# Patient Record
Sex: Male | Born: 1970 | Race: Black or African American | Hispanic: No | Marital: Married | Smoking: Current every day smoker
Health system: Southern US, Community
[De-identification: ages and names within clinical notes are randomized; demographics above are authoritative.]

---

## 1998-10-26 ENCOUNTER — Encounter: Payer: Self-pay | Admitting: Emergency Medicine

## 1998-10-26 ENCOUNTER — Emergency Department (HOSPITAL_COMMUNITY): Admission: EM | Admit: 1998-10-26 | Discharge: 1998-10-26 | Payer: Self-pay | Admitting: Emergency Medicine

## 1998-12-17 ENCOUNTER — Inpatient Hospital Stay (HOSPITAL_COMMUNITY): Admission: EM | Admit: 1998-12-17 | Discharge: 1998-12-19 | Payer: Self-pay | Admitting: Emergency Medicine

## 1998-12-17 ENCOUNTER — Encounter: Payer: Self-pay | Admitting: General Surgery

## 1998-12-17 ENCOUNTER — Encounter: Payer: Self-pay | Admitting: Emergency Medicine

## 1998-12-18 ENCOUNTER — Encounter: Payer: Self-pay | Admitting: General Surgery

## 1999-05-15 ENCOUNTER — Encounter: Admission: RE | Admit: 1999-05-15 | Discharge: 1999-05-15 | Payer: Self-pay | Admitting: Family Medicine

## 1999-08-01 ENCOUNTER — Encounter: Admission: RE | Admit: 1999-08-01 | Discharge: 1999-08-01 | Payer: Self-pay | Admitting: Family Medicine

## 2006-11-09 ENCOUNTER — Emergency Department (HOSPITAL_COMMUNITY): Admission: EM | Admit: 2006-11-09 | Discharge: 2006-11-10 | Payer: Self-pay | Admitting: Emergency Medicine

## 2007-07-26 ENCOUNTER — Emergency Department (HOSPITAL_COMMUNITY): Admission: EM | Admit: 2007-07-26 | Discharge: 2007-07-26 | Payer: Self-pay | Admitting: Emergency Medicine

## 2009-01-08 ENCOUNTER — Emergency Department (HOSPITAL_COMMUNITY): Admission: EM | Admit: 2009-01-08 | Discharge: 2009-01-08 | Payer: Self-pay | Admitting: Emergency Medicine

## 2009-01-10 ENCOUNTER — Emergency Department (HOSPITAL_COMMUNITY): Admission: EM | Admit: 2009-01-10 | Discharge: 2009-01-10 | Payer: Self-pay | Admitting: Emergency Medicine

## 2009-01-30 ENCOUNTER — Emergency Department (HOSPITAL_COMMUNITY): Admission: EM | Admit: 2009-01-30 | Discharge: 2009-01-31 | Payer: Self-pay | Admitting: Emergency Medicine

## 2009-10-14 ENCOUNTER — Emergency Department (HOSPITAL_COMMUNITY): Admission: EM | Admit: 2009-10-14 | Discharge: 2009-10-14 | Payer: Self-pay | Admitting: Emergency Medicine

## 2015-04-18 ENCOUNTER — Encounter (HOSPITAL_COMMUNITY): Payer: Self-pay | Admitting: Emergency Medicine

## 2015-04-18 ENCOUNTER — Emergency Department (HOSPITAL_COMMUNITY)
Admission: EM | Admit: 2015-04-18 | Discharge: 2015-04-19 | Disposition: A | Discharge status: Home / Self Care | Payer: Self-pay | Attending: Emergency Medicine | Admitting: Emergency Medicine

## 2015-04-18 DIAGNOSIS — Y998 Other external cause status: Secondary | ICD-10-CM | POA: Insufficient documentation

## 2015-04-18 DIAGNOSIS — W2209XA Striking against other stationary object, initial encounter: Secondary | ICD-10-CM | POA: Insufficient documentation

## 2015-04-18 DIAGNOSIS — Y9389 Activity, other specified: Secondary | ICD-10-CM | POA: Insufficient documentation

## 2015-04-18 DIAGNOSIS — S91011A Laceration without foreign body, right ankle, initial encounter: Secondary | ICD-10-CM | POA: Insufficient documentation

## 2015-04-18 DIAGNOSIS — Z23 Encounter for immunization: Secondary | ICD-10-CM | POA: Insufficient documentation

## 2015-04-18 DIAGNOSIS — Z720 Tobacco use: Secondary | ICD-10-CM | POA: Insufficient documentation

## 2015-04-18 DIAGNOSIS — Y9289 Other specified places as the place of occurrence of the external cause: Secondary | ICD-10-CM | POA: Insufficient documentation

## 2015-04-18 NOTE — ED Provider Notes (Signed)
CSN: [id number]     Arrival date & time 04/18/15  2229 History   This chart was scribed for non-physician practitioner, Dierdre ForthHannah Gray Doering PA-C working with April Palumbo, MD by Arlan OrganAshley Leger, ED Scribe. This patient was seen in room TR04C/TR04C and the patient's care was started at 12:07 AM.   Chief Complaint  Patient presents with  . Extremity Laceration   The history is provided by the patient and medical records. No language interpreter was used.    HPI Comments: Frank Wiley is a 44 y.o. male without any pertinent past medical history who presents to the Emergency Department here for a laceration to the R ankle sustained 4:30 PM this afternoon. Pt states he cut his ankle against a sharp pipe this evening. Bleeding controlled at this time. Area is opened and covered with duct tape. He now c/o constant, ongoing mild pain to area. No fever or chills. No weakness, numbness, or loss of sensation. Pt is unaware of Tetanus status. No known allergies to medications.  History reviewed. No pertinent past medical history. History reviewed. No pertinent past surgical history. History reviewed. No pertinent family history. History  Substance Use Topics  . Smoking status: Current Every Day Smoker -- 0.50 packs/day    Types: Cigarettes  . Smokeless tobacco: Not on file  . Alcohol Use: No    Review of Systems  Constitutional: Negative for fever and chills.  Gastrointestinal: Negative for nausea and vomiting.  Musculoskeletal: Positive for arthralgias.  Skin: Positive for wound.  Allergic/Immunologic: Negative for immunocompromised state.  Neurological: Negative for weakness and numbness.  Hematological: Does not bruise/bleed easily.  Psychiatric/Behavioral: Negative for confusion. The patient is not nervous/anxious.       Allergies  Review of patient's allergies indicates no known allergies.  Home Medications   Prior to Admission medications   Not on File   Triage Vitals: BP  121/84 mmHg  Pulse 73  Temp(Src) 98 F (36.7 C) (Oral)  Resp 22  SpO2 98%   Physical Exam  Constitutional: He is oriented to person, place, and time. He appears well-developed and well-nourished. No distress.  HENT:  Head: Normocephalic and atraumatic.  Eyes: Conjunctivae are normal. No scleral icterus.  Neck: Normal range of motion.  Cardiovascular: Normal rate, regular rhythm, normal heart sounds and intact distal pulses.   No murmur heard. Capillary refill < 3 sec  Pulmonary/Chest: Effort normal and breath sounds normal. No respiratory distress.  Musculoskeletal: Normal range of motion. He exhibits no edema.  ROM: Full range of motion of the right ankle  Neurological: He is alert and oriented to person, place, and time.  Sensation: Intact to dull and sharp in the right lower extremity Strength: 5/5 with dorsiflexion and plantar flexion of the right lower extremity  Skin: Skin is warm and dry. He is not diaphoretic.  3 cm laceration to the anterior right ankle  Psychiatric: He has a normal mood and affect.  Nursing note and vitals reviewed.   ED Course  Procedures (including critical care time)  DIAGNOSTIC STUDIES: Oxygen Saturation is 98% on RA, Normal by my interpretation.    COORDINATION OF CARE: 12:10 PM- Will perform laceration repair. Discussed treatment plan with pt at bedside and pt agreed to plan.     LACERATION REPAIR Performed by: Dierdre ForthHannah Shanasia Ibrahim PA-C  Consent: Verbal consent obtained. Risks and benefits: risks, benefits and alternatives were discussed Patient identity confirmed: provided demographic data Time out performed prior to procedure Prepped and Draped in normal sterile fashion  Wound explored Laceration Location: right ankle Laceration Length: 3cm No Foreign Bodies seen or palpated Anesthesia: local infiltration Local anesthetic: lidocaine 1% with epinephrine Anesthetic total: 5 ml Irrigation method: syringe Amount of cleaning:  standard Skin closure: 5-0 prolene Number of sutures or staples: 3 Technique: simple interrupted Patient tolerance: Patient tolerated the procedure well with no immediate complications.   Labs Review Labs Reviewed - No data to display  Imaging Review No results found.   EKG Interpretation None      MDM   Final diagnoses:  Laceration of right ankle, initial encounter   DELAWRENCE FRIDMAN presents with laceration to the right anterior ankle.  Pressure irrigation performed. Wound explored and base of wound visualized in a bloodless field without evidence of foreign body.  Laceration occurred < 8 hours prior to repair which was well tolerated. Tdap updated.  Pt has no comorbidities to effect normal wound healing. Pt discharged without antibiotics.  Discussed suture home care with patient and answered questions. Pt to follow-up for wound check and suture removal in 7 days; they are to return to the ED sooner for signs of infection. Patient given ASO splint for ankle to prevent wound dehiscence. Pt is hemodynamically stable with no complaints prior to dc.   I personally performed the services described in this documentation, which was scribed in my presence. The recorded information has been reviewed and is accurate.    Dierdre Forth, PA-C 04/19/15 0052  April Palumbo, MD 04/19/15 0139

## 2015-04-18 NOTE — ED Notes (Signed)
Patient cut right ankle on pipe. States happened today. Bleeding controlled at this time via duct tape bandage.

## 2015-04-19 MED ORDER — LIDOCAINE-EPINEPHRINE 1 %-1:100000 IJ SOLN
10.0000 mL | Freq: Once | INTRAMUSCULAR | Status: AC
  Administered 2015-04-19: 10 mL
  Filled 2015-04-19: qty 1

## 2015-04-19 MED ORDER — TETANUS-DIPHTH-ACELL PERTUSSIS 5-2.5-18.5 LF-MCG/0.5 IM SUSP
0.5000 mL | Freq: Once | INTRAMUSCULAR | Status: AC
  Administered 2015-04-19: 0.5 mL via INTRAMUSCULAR
  Filled 2015-04-19: qty 0.5

## 2015-04-19 NOTE — Discharge Instructions (Signed)

## 2015-12-31 ENCOUNTER — Ambulatory Visit: Payer: Self-pay | Admitting: Internal Medicine

## 2016-01-08 ENCOUNTER — Encounter: Payer: Self-pay | Admitting: Internal Medicine

## 2016-01-08 ENCOUNTER — Ambulatory Visit: Payer: Self-pay | Attending: Internal Medicine | Admitting: Internal Medicine

## 2016-01-08 VITALS — BP 120/78 | HR 69 | Temp 98.0°F | Resp 16 | Ht 65.0 in | Wt 180.8 lb

## 2016-01-08 DIAGNOSIS — F17210 Nicotine dependence, cigarettes, uncomplicated: Secondary | ICD-10-CM | POA: Insufficient documentation

## 2016-01-08 DIAGNOSIS — Z0001 Encounter for general adult medical examination with abnormal findings: Secondary | ICD-10-CM | POA: Insufficient documentation

## 2016-01-08 DIAGNOSIS — F17200 Nicotine dependence, unspecified, uncomplicated: Secondary | ICD-10-CM

## 2016-01-08 DIAGNOSIS — Z0000 Encounter for general adult medical examination without abnormal findings: Secondary | ICD-10-CM

## 2016-01-08 DIAGNOSIS — G250 Essential tremor: Secondary | ICD-10-CM

## 2016-01-08 LAB — CBC WITH DIFFERENTIAL/PLATELET
BASOS PCT: 1 %
Basophils Absolute: 44 cells/uL (ref 0–200)
EOS PCT: 1 %
Eosinophils Absolute: 44 cells/uL (ref 15–500)
HEMATOCRIT: 40.4 % (ref 38.5–50.0)
Hemoglobin: 13.6 g/dL (ref 13.2–17.1)
LYMPHS PCT: 45 %
Lymphs Abs: 1980 cells/uL (ref 850–3900)
MCH: 29.8 pg (ref 27.0–33.0)
MCHC: 33.7 g/dL (ref 32.0–36.0)
MCV: 88.4 fL (ref 80.0–100.0)
MPV: 9.1 fL (ref 7.5–12.5)
Monocytes Absolute: 308 cells/uL (ref 200–950)
Monocytes Relative: 7 %
NEUTROS PCT: 46 %
Neutro Abs: 2024 cells/uL (ref 1500–7800)
Platelets: 329 10*3/uL (ref 140–400)
RBC: 4.57 MIL/uL (ref 4.20–5.80)
RDW: 13.5 % (ref 11.0–15.0)
WBC: 4.4 10*3/uL (ref 3.8–10.8)

## 2016-01-08 MED ORDER — PROPRANOLOL HCL 20 MG PO TABS: 20.0000 mg | ORAL_TABLET | Freq: Two times a day (BID) | ORAL | Status: AC

## 2016-01-08 MED ORDER — PROPRANOLOL HCL 40 MG PO TABS: 40.0000 mg | ORAL_TABLET | Freq: Two times a day (BID) | ORAL | Status: DC

## 2016-01-08 NOTE — Progress Notes (Signed)
Patient here to establish care Currently on no prescribed medications Complains of having some tremors in his hands that  He has had from when he was little

## 2016-01-08 NOTE — Patient Instructions (Addendum)
Smoking Cessation, Tips for Success If you are ready to quit smoking, congratulations! You have chosen to help yourself be healthier. Cigarettes bring nicotine, tar, carbon monoxide, and other irritants into your body. Your lungs, heart, and blood vessels will be able to work better without these poisons. There are many different ways to quit smoking. Nicotine gum, nicotine patches, a nicotine inhaler, or nicotine nasal spray can help with physical craving. Hypnosis, support groups, and medicines help break the habit of smoking. WHAT THINGS CAN I DO TO MAKE QUITTING EASIER?  Here are some tips to help you quit for good:  Pick a date when you will quit smoking completely. Tell all of your friends and family about your plan to quit on that date.  Do not try to slowly cut down on the number of cigarettes you are smoking. Pick a quit date and quit smoking completely starting on that day.  Throw away all cigarettes.   Clean and remove all ashtrays from your home, work, and car.  On a card, write down your reasons for quitting. Carry the card with you and read it when you get the urge to smoke.  Cleanse your body of nicotine. Drink enough water and fluids to keep your urine clear or pale yellow. Do this after quitting to flush the nicotine from your body.  Learn to predict your moods. Do not let a bad situation be your excuse to have a cigarette. Some situations in your life might tempt you into wanting a cigarette.  Never have "just one" cigarette. It leads to wanting another and another. Remind yourself of your decision to quit.  Change habits associated with smoking. If you smoked while driving or when feeling stressed, try other activities to replace smoking. Stand up when drinking your coffee. Brush your teeth after eating. Sit in a different chair when you read the paper. Avoid alcohol while trying to quit, and try to drink fewer caffeinated beverages. Alcohol and caffeine may urge you to  smoke.  Avoid foods and drinks that can trigger a desire to smoke, such as sugary or spicy foods and alcohol.  Ask people who smoke not to smoke around you.  Have something planned to do right after eating or having a cup of coffee. For example, plan to take a walk or exercise.  Try a relaxation exercise to calm you down and decrease your stress. Remember, you may be tense and nervous for the first 2 weeks after you quit, but this will pass.  Find new activities to keep your hands busy. Play with a pen, coin, or rubber band. Doodle or draw things on paper.  Brush your teeth right after eating. This will help cut down on the craving for the taste of tobacco after meals. You can also try mouthwash.   Use oral substitutes in place of cigarettes. Try using lemon drops, carrots, cinnamon sticks, or chewing gum. Keep them handy so they are available when you have the urge to smoke.  When you have the urge to smoke, try deep breathing.  Designate your home as a nonsmoking area.  If you are a heavy smoker, ask your health care provider about a prescription for nicotine chewing gum. It can ease your withdrawal from nicotine.  Reward yourself. Set aside the cigarette money you save and buy yourself something nice.  Look for support from others. Join a support group or smoking cessation program. Ask someone at home or at work to help you with your plan   to quit smoking.  Always ask yourself, "Do I need this cigarette or is this just a reflex?" Tell yourself, "Today, I choose not to smoke," or "I do not want to smoke." You are reminding yourself of your decision to quit.  Do not replace cigarette smoking with electronic cigarettes (commonly called e-cigarettes). The safety of e-cigarettes is unknown, and some may contain harmful chemicals.  If you relapse, do not give up! Plan ahead and think about what you will do the next time you get the urge to smoke. HOW WILL I FEEL WHEN I QUIT SMOKING? You  may have symptoms of withdrawal because your body is used to nicotine (the addictive substance in cigarettes). You may crave cigarettes, be irritable, feel very hungry, cough often, get headaches, or have difficulty concentrating. The withdrawal symptoms are only temporary. They are strongest when you first quit but will go away within 10-14 days. When withdrawal symptoms occur, stay in control. Think about your reasons for quitting. Remind yourself that these are signs that your body is healing and getting used to being without cigarettes. Remember that withdrawal symptoms are easier to treat than the major diseases that smoking can cause.  Even after the withdrawal is over, expect periodic urges to smoke. However, these cravings are generally short lived and will go away whether you smoke or not. Do not smoke! WHAT RESOURCES ARE AVAILABLE TO HELP ME QUIT SMOKING? Your health care provider can direct you to community resources or hospitals for support, which may include:  Group support.  Education.  Hypnosis.  Therapy.   This information is not intended to replace advice given to you by your health care provider. Make sure you discuss any questions you have with your health care provider.   Document Released: 06/20/2004 Document Revised: 10/13/2014 Document Reviewed: 03/10/2013 Elsevier Interactive Patient Education 2016 [employer].   Essential Tremor A tremor is trembling or shaking that you cannot control. Most tremors affect the hands or arms. Tremors can also affect the head, vocal cords, face, and other parts of the body.  Essential tremor is a tremor without a known cause.  CAUSES Essential tremor has no known cause.  RISK FACTORS You may be at greater risk of essential tremor if:   You have a family member with essential tremor.   You are age 21 or older.   You take certain medicines. SIGNS AND SYMPTOMS The main sign of a tremor is uncontrolled and unintentional  rhythmic shaking of a body part.  You may have difficulty eating with a spoon or fork.   You may have difficulty writing.   You may nod your head up and down or side to side.   You may have a quivering voice.  Your tremors:  May get worse over time.   May come and go.   May be more noticeable on one side of your body.   May get worse due to stress, fatigue, caffeine, and extreme heat or cold.  DIAGNOSIS Your health care provider can diagnose essential tremor based on your symptoms, medical history, and a physical examination. There is no single test to diagnose an essential tremor. However, your health care provider may perform a variety of tests to rule out other conditions. Tests may include:   Blood and urine tests.   Imaging studies of your brain, such as:   CT scan.   MRI.   A test that measures involuntary muscle movement (electromyogram). TREATMENT Your tremors may go away without treatment.  Mild tremors may not need treatment if they do not affect your day-to-day life. Severe tremors may need to be treated using one or a combination of the following options:   Medicines. This may include medicine that is injected.  Lifestyle changes.   Physical therapy.  HOME CARE INSTRUCTIONS  Take medicines only as directed by your health care provider.   Limit alcohol intake to no more than 1 drink per day for nonpregnant women and 2 drinks per day for men. One drink equals 12 oz of beer, 5 oz of wine, or 1 oz of hard liquor.  Do not use any tobacco products, including cigarettes, chewing tobacco, or electronic cigarettes. If you need help quitting, ask your health care provider.  Take medicines only as directed by your health care provider.   Avoid extreme heat or cold.   Limit the amount of caffeine you consumeas directed by your health care provider.   Try to get eight hours of sleep each night.  Find ways to manage your stress, such as  meditation or yoga.  Keep all follow-up visits as directed by your health care provider. This is important. This includes any physical therapy visits. SEEK MEDICAL CARE IF:  You experience any changes in the location or intensity of your tremors.   You start having a tremor after starting a new medicine.   You have tremor with other symptoms such as:   Numbness.   Tingling.   Pain.   Weakness.   Your tremor gets worse.   Your tremor interferes with your daily life.    This information is not intended to replace advice given to you by your health care provider. Make sure you discuss any questions you have with your health care provider.   Document Released: 10/13/2014 Document Reviewed: 10/13/2014 Elsevier Interactive Patient Education [employer] Elsevier Inc.

## 2016-01-08 NOTE — Progress Notes (Signed)
Frank Wiley, is a 45 y.o. male  [id number]  [id number]  DOB - 10-Jun-1971  CC:  Chief Complaint  Patient presents with  . Establish Care       HPI: Frank Wiley is a 45 y.o. left-handed AAmale here today to establish medical care.  Never been to our clinic.  Once to take care of himselft better. C/o of tremors since he was a child. Worse when he is trying to write something w/ his left hand. His penmanship is already bad, tremors making it worse.  He denies any other c/o.  Hasn't eaten anything since this am.  Only drinks occasionally, but smokes about 5-6 cigs/day, knows he should stop.  Patient has No headache, No chest pain, No abdominal pain - No Nausea, No new weakness tingling or numbness, No Cough - SOB.  No weight loss/gain/  No Known Allergies History reviewed. No pertinent past medical history. No current outpatient prescriptions on file prior to visit.   No current facility-administered medications on file prior to visit.   History reviewed. No pertinent family history. Social History   Social History  . Marital Status: Married    Spouse Name: N/A  . Number of Children: N/A  . Years of Education: N/A   Occupational History  . Not on file.   Social History Main Topics  . Smoking status: Current Every Day Smoker -- 0.50 packs/day    Types: Cigarettes  . Smokeless tobacco: Not on file     Comment: marijuana  . Alcohol Use: No  . Drug Use: Yes    Special: Marijuana  . Sexual Activity: Not on file   Other Topics Concern  . Not on file   Social History Narrative    Review of Systems: Constitutional: Negative for fever, chills, diaphoresis, activity change, appetite change and fatigue. HENT: Negative for ear pain, nosebleeds, congestion, facial swelling, rhinorrhea, neck pain, neck stiffness and ear discharge.  Eyes: Negative for pain, discharge, redness, itching and visual disturbance. Respiratory: Negative for cough, choking, chest tightness,  shortness of breath, wheezing and stridor.  Cardiovascular: Negative for chest pain, palpitations and leg swelling. Gastrointestinal: Negative for abdominal distention. Genitourinary: Negative for dysuria, urgency, frequency, hematuria, flank pain, decreased urine volume, difficulty urinating and dyspareunia.  Musculoskeletal: Negative for back pain, joint swelling, arthralgia and gait problem. Neurological: Negative for dizziness,  seizures, syncope, facial asymmetry, speech difficulty, weakness, light-headedness, numbness and headaches.   +tremors, especially when trying to write w/ his left hand Hematological: Negative for adenopathy. Does not bruise/bleed easily. Psychiatric/Behavioral: Negative for hallucinations, behavioral problems, confusion, dysphoric mood, decreased concentration and agitation.    Objective:   Filed Vitals:   01/08/16 1530  BP: 120/78  Pulse: 69  Temp: 98 F (36.7 C)  Resp: 16    Physical Exam: Constitutional: Patient appears well-developed and well-nourished. No distress. AAOx3 HENT: Normocephalic, atraumatic, External right and left ear normal. Oropharynx is clear and moist.  Eyes: Conjunctivae and EOM are normal. PERRL, no scleral icterus. Neck: Normal ROM. Neck supple. No JVD. No tracheal deviation. No thyromegaly. CVS: RRR, S1/S2 +, no murmurs, no gallops, no carotid bruit.  Pulmonary: Effort and breath sounds normal, no stridor, rhonchi, wheezes, rales.  Abdominal: Soft. BS +, no distension, tenderness, rebound or guarding.  Musculoskeletal: Normal range of motion. No edema and no tenderness.  Lymphadenopathy: No lymphadenopathy noted, cervical, inguinal or axillary Neuro: Alert. Normal reflexes, muscle tone coordination. No cranial nerve deficit grossly. Skin: Skin is warm and dry. No rash  noted. Not diaphoretic. No erythema. No pallor. Psychiatric: Normal mood and affect. Behavior, judgment, thought content normal.  No results found for: WBC, HGB,  HCT, MCV, PLT No results found for: CREATININE, BUN, NA, K, CL, CO2  No results found for: HGBA1C Lipid Panel  No results found for: CHOL, TRIG, HDL, CHOLHDL, VLDL, LDLCALC     Assessment and plan:   1. Essential tremor, suspect intention tremor - trial propranolol 20bid (his bp is normal at this time, will hold off on trying higher dose of 40bid for this) - CBC with Differential - Vitamin D, 25-hydroxy - TSH - CMP and Liver  2. Health care maintenance - see above labs.  3. Tobacco use disorder - tobacco cessation counseling given.    Healthy diet and exercise encouraged w/ Pt. He use to weigh 300lbs! Until he started watching what he ate and exercised more.  Return in about 3 months (around 04/08/2016).  The patient was given clear instructions to go to ER or return to medical center if symptoms don't improve, worsen or new problems develop. The patient verbalized understanding. The patient was told to call to get lab results if they haven't heard anything in the next week.      Frank Glatterawn T [name], MD, MBA/MHA [facility] [place], [place] [phone]   01/08/2016, 4:26 PM

## 2016-01-09 ENCOUNTER — Other Ambulatory Visit: Payer: Self-pay | Admitting: Internal Medicine

## 2016-01-09 LAB — CMP AND LIVER
ALT: 11 U/L (ref 9–46)
AST: 16 U/L (ref 10–40)
Albumin: 4.2 g/dL (ref 3.6–5.1)
Alkaline Phosphatase: 50 U/L (ref 40–115)
BILIRUBIN DIRECT: 0.2 mg/dL (ref <=0.2)
BILIRUBIN TOTAL: 0.7 mg/dL (ref 0.2–1.2)
BUN: 10 mg/dL (ref 7–25)
CALCIUM: 8.9 mg/dL (ref 8.6–10.3)
CO2: 25 mmol/L (ref 20–31)
CREATININE: 0.83 mg/dL (ref 0.60–1.35)
Chloride: 107 mmol/L (ref 98–110)
Glucose, Bld: 91 mg/dL (ref 65–99)
Indirect Bilirubin: 0.5 mg/dL (ref 0.2–1.2)
Potassium: 3.9 mmol/L (ref 3.5–5.3)
Sodium: 141 mmol/L (ref 135–146)
TOTAL PROTEIN: 6.6 g/dL (ref 6.1–8.1)

## 2016-01-09 LAB — VITAMIN D 25 HYDROXY (VIT D DEFICIENCY, FRACTURES): Vit D, 25-Hydroxy: 11 ng/mL — ABNORMAL LOW (ref 30–100)

## 2016-01-09 LAB — TSH: TSH: 1.24 mIU/L (ref 0.40–4.50)

## 2016-01-09 MED ORDER — VITAMIN D (ERGOCALCIFEROL) 1.25 MG (50000 UNIT) PO CAPS: 50000.0000 [IU] | ORAL_CAPSULE | ORAL | Status: AC

## 2018-08-15 ENCOUNTER — Other Ambulatory Visit: Payer: Self-pay

## 2018-08-15 ENCOUNTER — Emergency Department (HOSPITAL_COMMUNITY)
Admission: EM | Admit: 2018-08-15 | Discharge: 2018-08-15 | Disposition: A | Discharge status: Home / Self Care | Payer: Self-pay | Attending: Emergency Medicine | Admitting: Emergency Medicine

## 2018-08-15 ENCOUNTER — Encounter (HOSPITAL_COMMUNITY): Payer: Self-pay | Admitting: Emergency Medicine

## 2018-08-15 ENCOUNTER — Emergency Department (HOSPITAL_COMMUNITY): Payer: Self-pay

## 2018-08-15 DIAGNOSIS — M7662 Achilles tendinitis, left leg: Secondary | ICD-10-CM | POA: Insufficient documentation

## 2018-08-15 DIAGNOSIS — F17210 Nicotine dependence, cigarettes, uncomplicated: Secondary | ICD-10-CM | POA: Insufficient documentation

## 2018-08-15 DIAGNOSIS — Z79899 Other long term (current) drug therapy: Secondary | ICD-10-CM | POA: Insufficient documentation

## 2018-08-15 MED ORDER — NAPROXEN 250 MG PO TABS
500.0000 mg | ORAL_TABLET | Freq: Once | ORAL | Status: AC
  Administered 2018-08-15: 500 mg via ORAL
  Filled 2018-08-15: qty 2

## 2018-08-15 MED ORDER — NAPROXEN 500 MG PO TABS: 500.0000 mg | ORAL_TABLET | Freq: Two times a day (BID) | ORAL | 0 refills | Status: AC | PRN

## 2018-08-15 MED ORDER — ACETAMINOPHEN 325 MG PO TABS
650.0000 mg | ORAL_TABLET | Freq: Once | ORAL | Status: AC
  Administered 2018-08-15: 650 mg via ORAL
  Filled 2018-08-15: qty 2

## 2018-08-15 NOTE — ED Triage Notes (Signed)
Pt reports waking up with pain to posterior foot/Achilles area. Denies pain to mid foot or calf. Incr'd pain with flexion. Denies injury, trauma, incr'd activity.

## 2018-08-15 NOTE — Discharge Instructions (Addendum)
It was my pleasure taking care of you today!   Rest! Ice the affected area couple of times a day (see instructions below).  Naproxen twice daily as needed for pain.  If you need further pain relief, you can also take Tylenol as needed.  If your symptoms are not improving in 1 week, please call the podiatrist (foot doctor) to schedule a follow-up appointment.  Return to ER for new or worsening symptoms, any additional concerns.    COLD THERAPY DIRECTIONS:  Ice or gel packs can be used to reduce both pain and swelling. Ice is the most helpful within the first 24 to 48 hours after an injury or flareup from overusing a muscle or joint.  Ice is effective, has very few side effects, and is safe for most people to use.   If you expose your skin to cold temperatures for too long or without the proper protection, you can damage your skin or nerves. Watch for signs of skin damage due to cold.   HOME CARE INSTRUCTIONS  Follow these tips to use ice and cold packs safely.  Place a dry or damp towel between the ice and skin. A damp towel will cool the skin more quickly, so you may need to shorten the time that the ice is used.  For a more rapid response, add gentle compression to the ice.  Ice for no more than 10 to 20 minutes at a time. The bonier the area you are icing, the less time it will take to get the benefits of ice.  Check your skin after 5 minutes to make sure there are no signs of a poor response to cold or skin damage.  Rest 20 minutes or more in between uses.  Once your skin is numb, you can end your treatment. You can test numbness by very lightly touching your skin. The touch should be so light that you do not see the skin dimple from the pressure of your fingertip. When using ice, most people will feel these normal sensations in this order: cold, burning, aching, and numbness.

## 2018-08-15 NOTE — ED Provider Notes (Signed)
[facility] EMERGENCY DEPARTMENT Provider Note   CSN: [id number] Arrival date & time: 08/15/18  0603     History   Chief Complaint Chief Complaint  Patient presents with  . Foot Pain    HPI Frank Wiley is a 47 y.o. male.  The history is provided by the patient and medical records. No language interpreter was used.   Frank Wiley is a 47 y.o. male with no pertinent PMH who presents to the Emergency Department complaining of pain to the back of the left heel which he noticed this morning upon getting out of bed.  Patient denies any known injury, trauma or inciting event.  No increased activity from baseline.  Denies any known redness or swelling to the area.  No fever or chills.  Pain is worse with ambulation and flexion/extension of ankle.  He denies any recent medication changes.  He is not on any statins and has had no recent antibiotic (FQ) use.  No history of gout.  No history of similar sxs.  No medications taken prior to arrival for his symptoms.  History reviewed. No pertinent past medical history.  Patient Active Problem List   Diagnosis Date Noted  . Essential tremor 01/08/2016    History reviewed. No pertinent surgical history.      Home Medications    Prior to Admission medications   Medication Sig Start Date End Date Taking? Authorizing Provider  naproxen (NAPROSYN) 500 MG tablet Take 1 tablet (500 mg total) by mouth 2 (two) times daily as needed. 08/15/18   Ward, Chase Picket, PA-C  propranolol (INDERAL) 20 MG tablet Take 1 tablet (20 mg total) by mouth 2 (two) times daily. 01/08/16   Pete Glatter, MD  Vitamin D, Ergocalciferol, (DRISDOL) 50000 units CAPS capsule Take 1 capsule (50,000 Units total) by mouth every 7 (seven) days. 01/09/16   Pete Glatter, MD    Family History History reviewed. No pertinent family history.  Social History Social History   Tobacco Use  . Smoking status: Current Every Day Smoker    Packs/day:  0.50    Types: Cigarettes  . Smokeless tobacco: Never Used  . Tobacco comment: marijuana  Substance Use Topics  . Alcohol use: No    Alcohol/week: 0.0 standard drinks  . Drug use: Yes    Types: Marijuana     Allergies   Patient has no known allergies.   Review of Systems Review of Systems  Constitutional: Negative for chills and fever.  Musculoskeletal: Positive for arthralgias.  Skin: Negative for color change and wound.  Neurological: Negative for weakness and numbness.     Physical Exam Updated Vital Signs BP 112/77 (BP Location: Right Arm)   Pulse 71   Temp 97.8 F (36.6 C) (Oral)   Resp 16   Ht 5\' 10"  (1.778 m)   Wt 90.7 kg   SpO2 97%   BMI 28.70 kg/m   Physical Exam  Constitutional: He is oriented to person, place, and time. He appears well-developed and well-nourished. No distress.  HENT:  Head: Normocephalic and atraumatic.  Cardiovascular: Normal rate, regular rhythm and normal heart sounds.  No murmur heard. Pulmonary/Chest: Effort normal and breath sounds normal. No respiratory distress.  Abdominal: Soft. He exhibits no distension. There is no tenderness.  Musculoskeletal:  Tenderness to palpation to the back of the left heel over achillis insertion point / bursa. No foot or calf tenderness.  Negative Thompson test.  Full ROM of the ankle and  foot although does reproduce pain. 2+ DP pulse. Sensation equal and intact. No open wounds.   Neurological: He is alert and oriented to person, place, and time.  Skin: Skin is warm and dry.  Nursing note and vitals reviewed.    ED Treatments / Results  Labs (all labs ordered are listed, but only abnormal results are displayed) Labs Reviewed - No data to display  EKG None  Radiology Dg Foot Complete Left  Result Date: 08/15/2018 CLINICAL DATA:  No known injury. Calcaneal pain. Initial encounter. EXAM: LEFT FOOT - COMPLETE 3+ VIEW COMPARISON:  None. FINDINGS: First MTP joint degenerative changes. Midfoot  degenerative changes. Posterior and plantar calcaneal spurring. No evidence for acute fracture or dislocation. Regional soft tissues unremarkable. IMPRESSION: 1. No acute osseous abnormality. 2. Degenerative changes. Electronically Signed   By: Annia Belt M.D.   On: 08/15/2018 07:56    Procedures Procedures (including critical care time)  Medications Ordered in ED Medications  acetaminophen (TYLENOL) tablet 650 mg (650 mg Oral Given 08/15/18 0704)  naproxen (NAPROSYN) tablet 500 mg (500 mg Oral Given 08/15/18 0704)     Initial Impression / Assessment and Plan / ED Course  I have reviewed the triage vital signs and the nursing notes.  Pertinent labs & imaging results that were available during my care of the patient were reviewed by me and considered in my medical decision making (see chart for details).    Frank Wiley is a 47 y.o. male who presents to ED for atraumatic left heel pain which began this morning. On exam, he is afebrile, hemodynamically stable and LE's are NVI. He has tenderness to the insertion point of the achilles right at bursa. Minimal overlying swelling. Negative Thompson test and full ROM. Doubt compromise to the achilles tendon. No calf tenderness or risk factors for DVT.  X-ray obtained showing no acute findings. Crutches provided.  Will treat symptomatically with RICE and NSAID's. Podiatry follow up provided if symptoms are not improving. All questions answered.   Patient discussed with Dr. Clarene Duke who agrees with treatment plan.    Final Clinical Impressions(s) / ED Diagnoses   Final diagnoses:  Achilles bursitis of left lower extremity    ED Discharge Orders         Ordered    naproxen (NAPROSYN) 500 MG tablet  2 times daily PRN     08/15/18 0808           Ward, Chase Picket, PA-C 08/15/18 0819    Little, Ambrose Finland, MD 08/15/18 1812

## 2019-01-27 ENCOUNTER — Encounter (HOSPITAL_COMMUNITY): Payer: Self-pay | Admitting: Emergency Medicine

## 2019-01-27 ENCOUNTER — Emergency Department (HOSPITAL_COMMUNITY)
Admission: EM | Admit: 2019-01-27 | Discharge: 2019-01-27 | Disposition: A | Discharge status: Home / Self Care | Payer: Self-pay | Attending: Emergency Medicine | Admitting: Emergency Medicine

## 2019-01-27 ENCOUNTER — Other Ambulatory Visit: Payer: Self-pay

## 2019-01-27 DIAGNOSIS — Z79899 Other long term (current) drug therapy: Secondary | ICD-10-CM | POA: Insufficient documentation

## 2019-01-27 DIAGNOSIS — K047 Periapical abscess without sinus: Secondary | ICD-10-CM | POA: Insufficient documentation

## 2019-01-27 DIAGNOSIS — F17210 Nicotine dependence, cigarettes, uncomplicated: Secondary | ICD-10-CM | POA: Insufficient documentation

## 2019-01-27 MED ORDER — PENICILLIN V POTASSIUM 500 MG PO TABS
500.0000 mg | ORAL_TABLET | Freq: Three times a day (TID) | ORAL | 0 refills | Status: AC
Start: 2019-01-27 — End: ?

## 2019-01-27 NOTE — ED Provider Notes (Signed)
[facility]-EMERGENCY DEPT Provider Note   CSN: [id number] Arrival date & time: 01/27/19  1103    History   Chief Complaint Chief Complaint  Patient presents with  . Dental Pain    HPI Frank Wiley is a 48 y.o. male.     HPI Patient is a 48 year old male presents to the emergency department with complaints of dental pain worsening over the past several days.  He states that he broke tooth #12 several months ago but has since developed swelling of the left side of his face and increasing pain over the past several days.  No fevers or chills.  No difficulty breathing or swallowing.  No neck pain.  No other complaints.  He has not called the dentist.   History reviewed. No pertinent past medical history.  Patient Active Problem List   Diagnosis Date Noted  . Essential tremor 01/08/2016    History reviewed. No pertinent surgical history.      Home Medications    Prior to Admission medications   Medication Sig Start Date End Date Taking? Authorizing Provider  naproxen (NAPROSYN) 500 MG tablet Take 1 tablet (500 mg total) by mouth 2 (two) times daily as needed. 08/15/18   Ward, Chase PicketJaime Pilcher, PA-C  penicillin v potassium (VEETID) 500 MG tablet Take 1 tablet (500 mg total) by mouth 3 (three) times daily. 01/27/19   Azalia Bilisampos, Eston Heslin, MD  propranolol (INDERAL) 20 MG tablet Take 1 tablet (20 mg total) by mouth 2 (two) times daily. 01/08/16   Pete GlatterLangeland, Dawn T, MD  Vitamin D, Ergocalciferol, (DRISDOL) 50000 units CAPS capsule Take 1 capsule (50,000 Units total) by mouth every 7 (seven) days. 01/09/16   Pete GlatterLangeland, Dawn T, MD    Family History No family history on file.  Social History Social History   Tobacco Use  . Smoking status: Current Every Day Smoker    Packs/day: 0.50    Types: Cigarettes  . Smokeless tobacco: Never Used  . Tobacco comment: marijuana  Substance Use Topics  . Alcohol use: No    Alcohol/week: 0.0 standard drinks  . Drug use: Yes    Types: Marijuana     Allergies   Patient has no known allergies.   Review of Systems Review of Systems  All other systems reviewed and are negative.    Physical Exam Updated Vital Signs BP 132/84 (BP Location: Right Arm) Comment: Simultaneous filing. User may not have seen previous data.  Pulse 68 Comment: Simultaneous filing. User may not have seen previous data.  Temp 98.2 F (36.8 C) (Oral)   Resp 18   SpO2 99% Comment: Simultaneous filing. User may not have seen previous data.  Physical Exam Vitals signs and nursing note reviewed.  Constitutional:      Appearance: He is well-developed.  HENT:     Head: Normocephalic.     Comments: Decay of tooth #12 with mild tenderness.  No obvious gingival swelling or fluctuance.  No drainage.  No trismus or malocclusion.  No no trismus.  Tolerating secretions.  Oral airway patent.  No swelling under his tongue.  Anterior neck is normal Neck:     Musculoskeletal: Normal range of motion and neck supple.     Comments: Anterior neck is normal Pulmonary:     Effort: Pulmonary effort is normal.  Abdominal:     General: There is no distension.  Musculoskeletal: Normal range of motion.  Neurological:     Mental Status: He is alert and oriented to person,  place, and time.      ED Treatments / Results  Labs (all labs ordered are listed, but only abnormal results are displayed) Labs Reviewed - No data to display  EKG None  Radiology No results found.  Procedures Procedures (including critical care time)  Medications Ordered in ED Medications - No data to display   Initial Impression / Assessment and Plan / ED Course  I have reviewed the triage vital signs and the nursing notes.  Pertinent labs & imaging results that were available during my care of the patient were reviewed by me and considered in my medical decision making (see chart for details).        Dental Pain. Home with antibiotics and pain medicine.  Recommend dental follow up. No signs of gingival abscess. Tolerating secretions. Airway patent. No sub lingular swelling   Final Clinical Impressions(s) / ED Diagnoses   Final diagnoses:  Dental infection    ED Discharge Orders         Ordered    penicillin v potassium (VEETID) 500 MG tablet  3 times daily     01/27/19 1125           Azalia Bilis, MD 01/27/19 1128

## 2019-01-27 NOTE — ED Triage Notes (Signed)
Patient c/o left upper dental pain since yesterday. Reports broken tooth. Denies cough, SOB, fevers.

## 2019-01-27 NOTE — ED Notes (Signed)
ED Provider at bedside. 

## 2019-04-29 IMAGING — CR DG FOOT COMPLETE 3+V*L*
3 series · 3 of 3 positions shown · non-contrast
Comparison: None.

CLINICAL DATA: No known injury. Calcaneal pain. Initial encounter.

EXAM:
LEFT FOOT - COMPLETE 3+ VIEW

[foot ap]
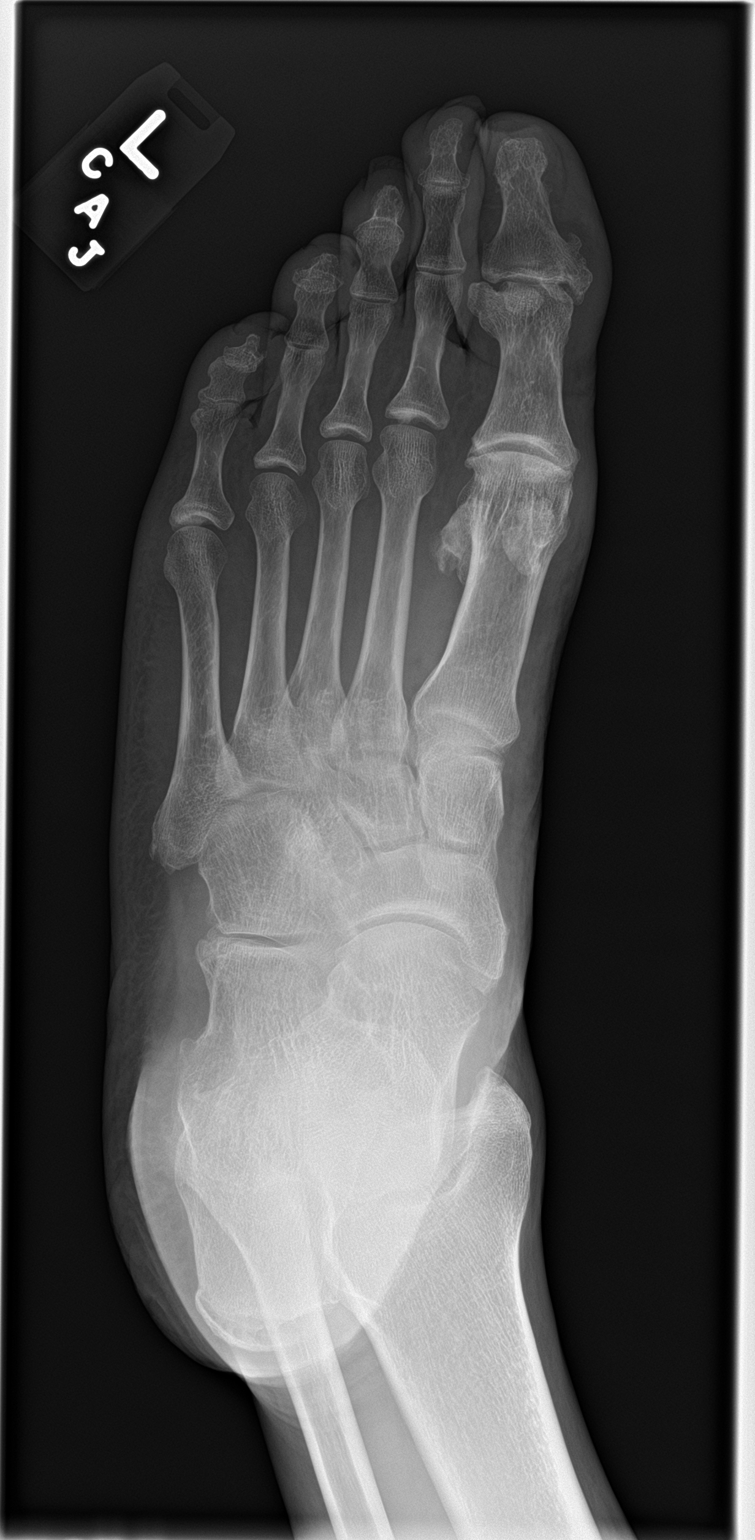

[foot obl]
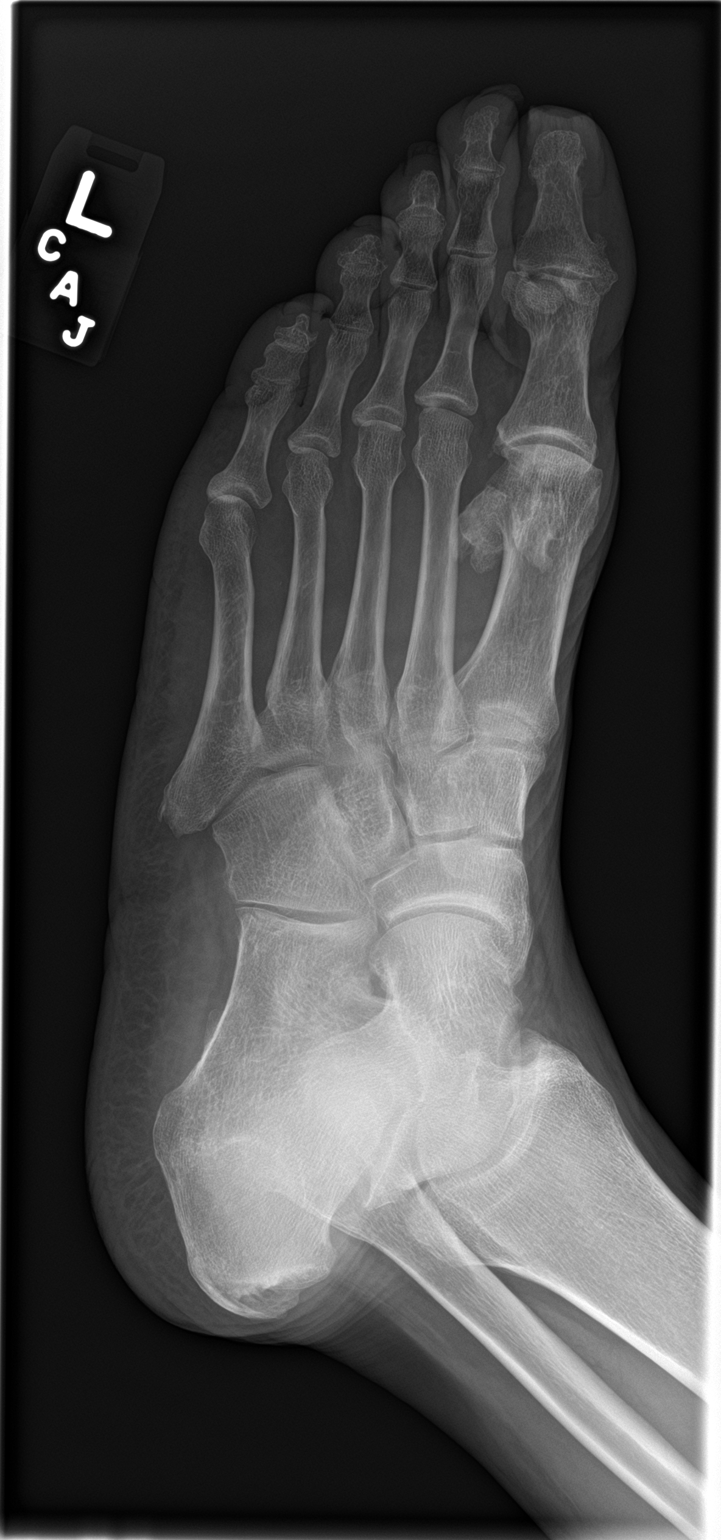

[foot lat]
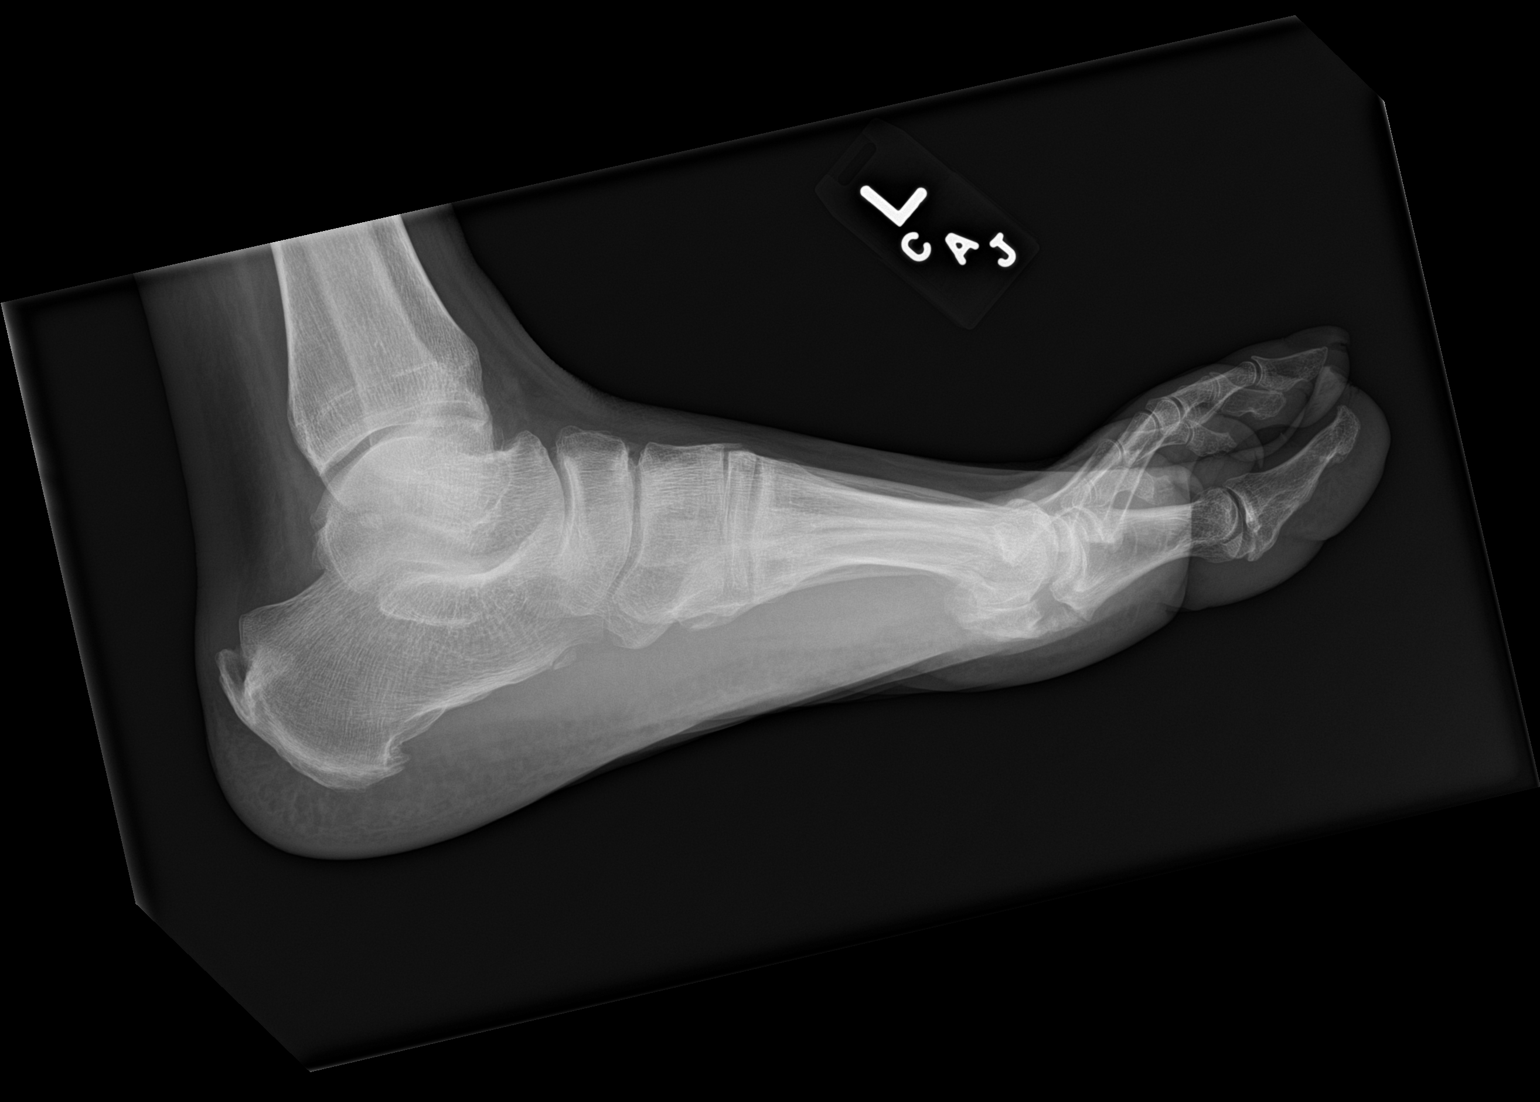

[3 of 3 positions shown; findings below may reference images not displayed]

FINDINGS: First MTP joint degenerative changes. Midfoot degenerative changes.
Posterior and plantar calcaneal spurring. No evidence for acute
fracture or dislocation. Regional soft tissues unremarkable.
IMPRESSION: 1. No acute osseous abnormality.
2. Degenerative changes.

## 2019-11-04 ENCOUNTER — Ambulatory Visit: Payer: Self-pay

## 2019-11-04 ENCOUNTER — Ambulatory Visit: Payer: Self-pay | Attending: Internal Medicine

## 2019-11-04 DIAGNOSIS — Z20822 Contact with and (suspected) exposure to covid-19: Secondary | ICD-10-CM | POA: Insufficient documentation

## 2019-11-06 LAB — NOVEL CORONAVIRUS, NAA
